# Patient Record
Sex: Female | Born: 1980 | Race: Black or African American | Hispanic: No | Marital: Married | Smoking: Never smoker
Health system: Southern US, Community
[De-identification: ages and names within clinical notes are randomized; demographics above are authoritative.]

## PROBLEM LIST (undated history)

## (undated) DIAGNOSIS — R569 Unspecified convulsions: Secondary | ICD-10-CM

## (undated) DIAGNOSIS — R87619 Unspecified abnormal cytological findings in specimens from cervix uteri: Secondary | ICD-10-CM

## (undated) DIAGNOSIS — IMO0002 Reserved for concepts with insufficient information to code with codable children: Secondary | ICD-10-CM

---

## 1999-11-13 ENCOUNTER — Other Ambulatory Visit: Admission: RE | Admit: 1999-11-13 | Discharge: 1999-11-13 | Payer: Self-pay | Admitting: *Deleted

## 2000-02-12 ENCOUNTER — Encounter: Payer: Self-pay | Admitting: *Deleted

## 2000-02-12 ENCOUNTER — Ambulatory Visit (HOSPITAL_COMMUNITY): Admission: RE | Admit: 2000-02-12 | Discharge: 2000-02-12 | Payer: Self-pay | Admitting: *Deleted

## 2000-03-13 ENCOUNTER — Inpatient Hospital Stay (HOSPITAL_COMMUNITY): Admission: AD | Admit: 2000-03-13 | Discharge: 2000-03-13 | Payer: Self-pay | Admitting: *Deleted

## 2000-03-22 ENCOUNTER — Inpatient Hospital Stay: Admission: AD | Admit: 2000-03-22 | Discharge: 2000-03-22 | Payer: Self-pay | Admitting: *Deleted

## 2000-03-25 ENCOUNTER — Encounter (HOSPITAL_COMMUNITY): Admission: RE | Admit: 2000-03-25 | Discharge: 2000-04-12 | Payer: Self-pay | Admitting: *Deleted

## 2000-03-25 ENCOUNTER — Encounter: Payer: Self-pay | Admitting: *Deleted

## 2000-04-01 ENCOUNTER — Inpatient Hospital Stay (HOSPITAL_COMMUNITY): Admission: AD | Admit: 2000-04-01 | Discharge: 2000-04-01 | Payer: Self-pay | Admitting: *Deleted

## 2000-04-08 ENCOUNTER — Inpatient Hospital Stay (HOSPITAL_COMMUNITY): Admission: AD | Admit: 2000-04-08 | Discharge: 2000-04-13 | Payer: Self-pay | Admitting: Obstetrics and Gynecology

## 2000-04-08 ENCOUNTER — Encounter: Payer: Self-pay | Admitting: Obstetrics & Gynecology

## 2000-04-09 ENCOUNTER — Encounter: Payer: Self-pay | Admitting: Obstetrics & Gynecology

## 2000-05-18 ENCOUNTER — Encounter: Admission: RE | Admit: 2000-05-18 | Discharge: 2000-08-16 | Payer: Self-pay | Admitting: Obstetrics and Gynecology

## 2001-04-04 ENCOUNTER — Emergency Department (HOSPITAL_COMMUNITY): Admission: EM | Admit: 2001-04-04 | Discharge: 2001-04-04 | Payer: Self-pay | Admitting: *Deleted

## 2003-02-18 ENCOUNTER — Other Ambulatory Visit: Admission: RE | Admit: 2003-02-18 | Discharge: 2003-02-18 | Payer: Self-pay

## 2003-09-13 ENCOUNTER — Emergency Department (HOSPITAL_COMMUNITY): Admission: EM | Admit: 2003-09-13 | Discharge: 2003-09-13 | Payer: Self-pay | Admitting: Family Medicine

## 2005-06-11 ENCOUNTER — Emergency Department (HOSPITAL_COMMUNITY): Admission: EM | Admit: 2005-06-11 | Discharge: 2005-06-11 | Payer: Self-pay | Admitting: Family Medicine

## 2010-11-27 ENCOUNTER — Ambulatory Visit (HOSPITAL_COMMUNITY)
Admission: RE | Admit: 2010-11-27 | Discharge: 2010-11-27 | Disposition: A | Discharge status: Home / Self Care | Payer: 59 | Source: Ambulatory Visit | Attending: Obstetrics and Gynecology | Admitting: Obstetrics and Gynecology

## 2010-11-27 ENCOUNTER — Other Ambulatory Visit (HOSPITAL_COMMUNITY): Payer: Self-pay | Admitting: Obstetrics and Gynecology

## 2010-11-27 DIAGNOSIS — O2630 Retained intrauterine contraceptive device in pregnancy, unspecified trimester: Secondary | ICD-10-CM

## 2010-11-27 DIAGNOSIS — Z3689 Encounter for other specified antenatal screening: Secondary | ICD-10-CM | POA: Insufficient documentation

## 2011-01-07 LAB — HEPATITIS B SURFACE ANTIGEN: Hepatitis B Surface Ag: NEGATIVE

## 2011-01-07 LAB — ANTIBODY SCREEN: Antibody Screen: NEGATIVE

## 2011-01-07 LAB — ABO/RH: RH Type: POSITIVE

## 2011-01-07 LAB — GC/CHLAMYDIA PROBE AMP, GENITAL
Chlamydia: NEGATIVE
Gonorrhea: NEGATIVE

## 2011-01-07 LAB — RPR: RPR: NONREACTIVE

## 2011-01-07 LAB — RUBELLA ANTIBODY, IGM: Rubella: IMMUNE

## 2011-01-07 LAB — HIV ANTIBODY (ROUTINE TESTING W REFLEX): HIV: NONREACTIVE

## 2011-05-11 NOTE — L&D Delivery Note (Signed)
Delivery Note At 6:55 AM a viable female, "Monique Bailey",  was delivered via Vaginal, Spontaneous Delivery (Presentation: Middle Occiput Posterior).  APGAR: 9, 9; weight 8 lb 10.1 oz (3915 g).   Placenta status: Intact, Spontaneous.  Cord: 3 vessels with the following complications: None.  Cord pH: NA  Anesthesia: Epidural  Episiotomy: None Lacerations: 1st degree vaginal at introitus, 1st degree periclitoral--minimal suturing needed for hemostasis Suture Repair: 3.0 Monocryl Est. Blood Loss (mL): 200 cc  Mom to postpartum.  Baby to skin to skin with mother.  Nigel Bridgeman 07/12/2011, 7:56 AM

## 2011-06-22 LAB — STREP B DNA PROBE: GBS: NEGATIVE

## 2011-07-08 ENCOUNTER — Encounter (INDEPENDENT_AMBULATORY_CARE_PROVIDER_SITE_OTHER): Payer: 59 | Admitting: Registered Nurse

## 2011-07-08 DIAGNOSIS — Z331 Pregnant state, incidental: Secondary | ICD-10-CM

## 2011-07-11 ENCOUNTER — Inpatient Hospital Stay (HOSPITAL_COMMUNITY)
Admission: AD | Admit: 2011-07-11 | Discharge: 2011-07-14 | DRG: 775 | Disposition: A | Discharge status: Home / Self Care | Payer: 59 | Source: Ambulatory Visit | Attending: Obstetrics and Gynecology | Admitting: Obstetrics and Gynecology

## 2011-07-11 ENCOUNTER — Encounter (HOSPITAL_COMMUNITY): Payer: Self-pay | Admitting: *Deleted

## 2011-07-11 DIAGNOSIS — IMO0001 Reserved for inherently not codable concepts without codable children: Secondary | ICD-10-CM

## 2011-07-11 DIAGNOSIS — IMO0002 Reserved for concepts with insufficient information to code with codable children: Secondary | ICD-10-CM | POA: Diagnosis present

## 2011-07-11 HISTORY — DX: Unspecified convulsions: R56.9

## 2011-07-11 HISTORY — DX: Unspecified abnormal cytological findings in specimens from cervix uteri: R87.619

## 2011-07-11 HISTORY — DX: Reserved for concepts with insufficient information to code with codable children: IMO0002

## 2011-07-11 LAB — AMNISURE RUPTURE OF MEMBRANE (ROM) NOT AT ARMC: Amnisure ROM: POSITIVE

## 2011-07-11 NOTE — ED Provider Notes (Signed)
History    31 yo [phone] (previous preterm delivery of twins 11 years ago) at 68 6/7 weeks presented c/o UCs q 8-10 minutes, with spotting tonight.  Has contracted most of day, but irregularly.  Reports + FM today, questionable episode of wetness at about 8pm.  Cervix was 2 cm at last exam 2 weeks ago.  Pregnancy remarkable for: Previous preterm labor/delivery at 32 weeks 11 years ago Elevated BMI Hx cryo Pregnancy with suspected presence of IUD, but no IUD visualized via [place] (MRI and Xray declined) Remote hx STDs Fibroids Hx seizures as teenager, none since  Chief Complaint  Patient presents with  . Labor Eval     OB History    Grav Para Term Preterm Abortions TAB SAB Ect Mult Living   3 1 0 1 1 0 1 0 1 2       History reviewed. No pertinent past medical history.  History reviewed. No pertinent past surgical history.  History reviewed. No pertinent family history.  History  Substance Use Topics  . Smoking status: Never Smoker   . Smokeless tobacco: Not on file  . Alcohol Use: No    Allergies: No Known Allergies  Prescriptions prior to admission  Medication Sig Dispense Refill  . Prenatal Vit-Fe Fumarate-FA (PRENATAL MULTIVITAMIN) TABS Take 1 tablet by mouth daily.        Physical Exam   Blood pressure 121/69, pulse 93.  Chest clear Heart RRR without murmur Abd gravid, NT Pelvic:  No obvious leaking noted.  Small amount reddish/brown d/c on vulva and on glove after exam.  No active bleeding.  Cervix 2-3, 90%, vtx, -1/-2. Ext WNL  FHR reactive, no decels UCs very irregular.   ED Course  IUP at 37 6/7 weeks Early vs. Prodromal labor  Will ambulate x 1 hour, then re-evaluate.  Nigel Bridgeman, CNM, [place] 07/11/11 11:20pm

## 2011-07-11 NOTE — Progress Notes (Signed)
Pt presents to mau for labor check. Ctx started this evening around 830pm.  Had some pressure this am.  Was 2cm in office on last check.

## 2011-07-11 NOTE — Progress Notes (Signed)
V. Latham, CNM at bedside.  Assessment done and poc discussed with pt.  

## 2011-07-11 NOTE — Progress Notes (Signed)
Amnisure being collected per CNM.

## 2011-07-11 NOTE — Progress Notes (Signed)
Pt up to ambulate at this time.

## 2011-07-12 ENCOUNTER — Encounter (HOSPITAL_COMMUNITY): Payer: Self-pay | Admitting: Anesthesiology

## 2011-07-12 ENCOUNTER — Inpatient Hospital Stay (HOSPITAL_COMMUNITY): Payer: 59 | Admitting: Anesthesiology

## 2011-07-12 ENCOUNTER — Encounter: Payer: 59 | Admitting: Obstetrics and Gynecology

## 2011-07-12 ENCOUNTER — Encounter (HOSPITAL_COMMUNITY): Payer: Self-pay | Admitting: *Deleted

## 2011-07-12 DIAGNOSIS — IMO0002 Reserved for concepts with insufficient information to code with codable children: Secondary | ICD-10-CM | POA: Diagnosis present

## 2011-07-12 DIAGNOSIS — IMO0001 Reserved for inherently not codable concepts without codable children: Secondary | ICD-10-CM

## 2011-07-12 LAB — CBC
HCT: 38.1 % (ref 36.0–46.0)
Hemoglobin: 12.9 g/dL (ref 12.0–15.0)
MCH: 28 pg (ref 26.0–34.0)
MCHC: 33.9 g/dL (ref 30.0–36.0)
MCV: 82.6 fL (ref 78.0–100.0)
Platelets: 238 10*3/uL (ref 150–400)
RBC: 4.61 MIL/uL (ref 3.87–5.11)
RDW: 13.3 % (ref 11.5–15.5)
WBC: 8.9 10*3/uL (ref 4.0–10.5)

## 2011-07-12 LAB — RPR: RPR Ser Ql: NONREACTIVE

## 2011-07-12 MED ORDER — DIPHENHYDRAMINE HCL 50 MG/ML IJ SOLN: 12.5000 mg | INTRAMUSCULAR | Status: DC | PRN

## 2011-07-12 MED ORDER — BENZOCAINE-MENTHOL 20-0.5 % EX AERO: 1.0000 "application " | INHALATION_SPRAY | CUTANEOUS | Status: DC | PRN

## 2011-07-12 MED ORDER — MEASLES, MUMPS & RUBELLA VAC ~~LOC~~ INJ: 0.5000 mL | INJECTION | Freq: Once | SUBCUTANEOUS | Status: DC

## 2011-07-12 MED ORDER — LIDOCAINE HCL (PF) 1 % IJ SOLN
30.0000 mL | INTRAMUSCULAR | Status: DC | PRN
  Administered 2011-07-12: 30 mL via SUBCUTANEOUS
  Filled 2011-07-12: qty 30

## 2011-07-12 MED ORDER — LACTATED RINGERS IV SOLN
INTRAVENOUS | Status: DC
  Administered 2011-07-12: 01:00:00 via INTRAVENOUS
  Administered 2011-07-12: 125 mL/h via INTRAVENOUS

## 2011-07-12 MED ORDER — ONDANSETRON HCL 4 MG PO TABS: 4.0000 mg | ORAL_TABLET | ORAL | Status: DC | PRN

## 2011-07-12 MED ORDER — ONDANSETRON HCL 4 MG/2ML IJ SOLN: 4.0000 mg | INTRAMUSCULAR | Status: DC | PRN

## 2011-07-12 MED ORDER — LANOLIN HYDROUS EX OINT: TOPICAL_OINTMENT | CUTANEOUS | Status: DC | PRN

## 2011-07-12 MED ORDER — CITRIC ACID-SODIUM CITRATE 334-500 MG/5ML PO SOLN: 30.0000 mL | ORAL | Status: DC | PRN

## 2011-07-12 MED ORDER — OXYCODONE-ACETAMINOPHEN 5-325 MG PO TABS
1.0000 | ORAL_TABLET | ORAL | Status: DC | PRN
  Administered 2011-07-13 – 2011-07-14 (×3): 1 via ORAL
  Filled 2011-07-12 (×3): qty 1

## 2011-07-12 MED ORDER — SIMETHICONE 80 MG PO CHEW: 80.0000 mg | CHEWABLE_TABLET | ORAL | Status: DC | PRN

## 2011-07-12 MED ORDER — LIDOCAINE HCL (PF) 1 % IJ SOLN
INTRAMUSCULAR | Status: DC | PRN
  Administered 2011-07-12 (×2): 5 mL

## 2011-07-12 MED ORDER — DIPHENHYDRAMINE HCL 25 MG PO CAPS: 25.0000 mg | ORAL_CAPSULE | Freq: Four times a day (QID) | ORAL | Status: DC | PRN

## 2011-07-12 MED ORDER — FENTANYL 2.5 MCG/ML BUPIVACAINE 1/10 % EPIDURAL INFUSION (WH - ANES)
INTRAMUSCULAR | Status: DC | PRN
  Administered 2011-07-12: 14 mL/h
  Administered 2011-07-12: 16 mL/h via EPIDURAL

## 2011-07-12 MED ORDER — ZOLPIDEM TARTRATE 5 MG PO TABS: 5.0000 mg | ORAL_TABLET | Freq: Every evening | ORAL | Status: DC | PRN

## 2011-07-12 MED ORDER — PHENYLEPHRINE 40 MCG/ML (10ML) SYRINGE FOR IV PUSH (FOR BLOOD PRESSURE SUPPORT)
80.0000 ug | PREFILLED_SYRINGE | INTRAVENOUS | Status: DC | PRN
  Filled 2011-07-12: qty 5

## 2011-07-12 MED ORDER — FENTANYL 2.5 MCG/ML BUPIVACAINE 1/10 % EPIDURAL INFUSION (WH - ANES)
14.0000 mL/h | INTRAMUSCULAR | Status: DC
  Filled 2011-07-12 (×2): qty 60

## 2011-07-12 MED ORDER — FLEET ENEMA 7-19 GM/118ML RE ENEM: 1.0000 | ENEMA | RECTAL | Status: DC | PRN

## 2011-07-12 MED ORDER — BUTORPHANOL TARTRATE 2 MG/ML IJ SOLN: 1.0000 mg | INTRAMUSCULAR | Status: DC | PRN

## 2011-07-12 MED ORDER — SENNOSIDES-DOCUSATE SODIUM 8.6-50 MG PO TABS
2.0000 | ORAL_TABLET | Freq: Every day | ORAL | Status: DC
  Administered 2011-07-12 – 2011-07-13 (×2): 2 via ORAL

## 2011-07-12 MED ORDER — MAGNESIUM HYDROXIDE 400 MG/5ML PO SUSP: 30.0000 mL | ORAL | Status: DC | PRN

## 2011-07-12 MED ORDER — IBUPROFEN 600 MG PO TABS: 600.0000 mg | ORAL_TABLET | Freq: Four times a day (QID) | ORAL | Status: DC | PRN

## 2011-07-12 MED ORDER — EPHEDRINE 5 MG/ML INJ: 10.0000 mg | INTRAVENOUS | Status: DC | PRN

## 2011-07-12 MED ORDER — ACETAMINOPHEN 325 MG PO TABS: 650.0000 mg | ORAL_TABLET | ORAL | Status: DC | PRN

## 2011-07-12 MED ORDER — PRENATAL MULTIVITAMIN CH
1.0000 | ORAL_TABLET | Freq: Every day | ORAL | Status: DC
  Administered 2011-07-12 – 2011-07-13 (×2): 1 via ORAL
  Filled 2011-07-12 (×3): qty 1

## 2011-07-12 MED ORDER — EPHEDRINE 5 MG/ML INJ
10.0000 mg | INTRAVENOUS | Status: DC | PRN
  Filled 2011-07-12: qty 4

## 2011-07-12 MED ORDER — ONDANSETRON HCL 4 MG/2ML IJ SOLN: 4.0000 mg | Freq: Four times a day (QID) | INTRAMUSCULAR | Status: DC | PRN

## 2011-07-12 MED ORDER — LACTATED RINGERS IV SOLN: 500.0000 mL | Freq: Once | INTRAVENOUS | Status: DC

## 2011-07-12 MED ORDER — OXYTOCIN 20 UNITS IN LACTATED RINGERS INFUSION - SIMPLE: 125.0000 mL/h | Freq: Once | INTRAVENOUS | Status: DC

## 2011-07-12 MED ORDER — DIBUCAINE 1 % RE OINT: 1.0000 "application " | TOPICAL_OINTMENT | RECTAL | Status: DC | PRN

## 2011-07-12 MED ORDER — TETANUS-DIPHTH-ACELL PERTUSSIS 5-2.5-18.5 LF-MCG/0.5 IM SUSP: 0.5000 mL | Freq: Once | INTRAMUSCULAR | Status: DC

## 2011-07-12 MED ORDER — PHENYLEPHRINE 40 MCG/ML (10ML) SYRINGE FOR IV PUSH (FOR BLOOD PRESSURE SUPPORT): 80.0000 ug | PREFILLED_SYRINGE | INTRAVENOUS | Status: DC | PRN

## 2011-07-12 MED ORDER — WITCH HAZEL-GLYCERIN EX PADS: 1.0000 "application " | MEDICATED_PAD | CUTANEOUS | Status: DC | PRN

## 2011-07-12 MED ORDER — IBUPROFEN 600 MG PO TABS
600.0000 mg | ORAL_TABLET | Freq: Four times a day (QID) | ORAL | Status: DC
  Administered 2011-07-12 – 2011-07-14 (×8): 600 mg via ORAL
  Filled 2011-07-12 (×8): qty 1

## 2011-07-12 MED ORDER — OXYTOCIN BOLUS FROM INFUSION
500.0000 mL | Freq: Once | INTRAVENOUS | Status: AC
  Administered 2011-07-12: 500 mL via INTRAVENOUS
  Filled 2011-07-12: qty 500
  Filled 2011-07-12: qty 1000

## 2011-07-12 MED ORDER — OXYCODONE-ACETAMINOPHEN 5-325 MG PO TABS: 1.0000 | ORAL_TABLET | ORAL | Status: DC | PRN

## 2011-07-12 MED ORDER — LACTATED RINGERS IV SOLN: 500.0000 mL | INTRAVENOUS | Status: DC | PRN

## 2011-07-12 NOTE — Anesthesia Preprocedure Evaluation (Signed)
Anesthesia Evaluation  Patient identified by MRN, date of birth, ID band Patient awake    Reviewed: Allergy & Precautions, H&P , Patient's Chart, lab work & pertinent test results  Airway Mallampati: III TM Distance: >3 FB Neck ROM: full    Dental No notable dental hx. (+) Teeth Intact   Pulmonary neg pulmonary ROS,  breath sounds clear to auscultation  Pulmonary exam normal       Cardiovascular negative cardio ROS  Rhythm:regular Rate:Normal     Neuro/Psych Remote Hx/o Seizures negative neurological ROS  negative psych ROS   GI/Hepatic negative GI ROS, Neg liver ROS,   Endo/Other  Morbid obesity  Renal/GU negative Renal ROS  negative genitourinary   Musculoskeletal   Abdominal   Peds  Hematology negative hematology ROS (+)   Anesthesia Other Findings   Reproductive/Obstetrics (+) Pregnancy                           Anesthesia Physical Anesthesia Plan  ASA: III  Anesthesia Plan: Epidural   Post-op Pain Management:    Induction:   Airway Management Planned:   Additional Equipment:   Intra-op Plan:   Post-operative Plan:   Informed Consent: I have reviewed the patients History and Physical, chart, labs and discussed the procedure including the risks, benefits and alternatives for the proposed anesthesia with the patient or authorized representative who has indicated his/her understanding and acceptance.     Plan Discussed with: Anesthesiologist  Anesthesia Plan Comments:         Anesthesia Quick Evaluation

## 2011-07-12 NOTE — Progress Notes (Signed)
  Subjective: Comfortable after epidural.    Objective: BP 108/71  Pulse 78  Temp(Src) 98 F (36.7 C) (Oral)  Resp 18  Ht 5\' 11"  (1.803 m)  Wt 124.739 kg (275 lb)  BMI 38.35 kg/m2      FHT: Reasuring, occasional very quick variable. UC:   irregular, every 4-7 minutes SVE:   Dilation: 6 Effacement (%): 100 Station: -1 Exam by:: Emilee Hero, CNM Forebag felt--AROM, clear fluid.  Labs: Lab Results  Component Value Date   WBC 8.9 07/12/2011   HGB 12.9 07/12/2011   HCT 38.1 07/12/2011   MCV 82.6 07/12/2011   PLT 238 07/12/2011    Assessment / Plan: Spontaneous labor, progressing normally  Will observe progress and contraction pattern s/p rupture of forewaters--will augment prn.  Virginie Josten 07/12/2011, 3:17 AM

## 2011-07-12 NOTE — H&P (Signed)
31 yo [id number] (previous preterm delivery of twins 11 years ago) at 9 6/7 weeks presented initally c/o UCs q 8-10 minutes, with spotting tonight. Has contracted most of day, but irregularly. Reports + FM today, questionable episode of wetness at about 8pm. Cervix was 2 cm at last exam 2 weeks ago.   On arrival in [facility], she was initially 2+, now 3-4 cm after walking, with stronger UCs and positive amnisure.  Pregnancy remarkable for:  Previous preterm labor/delivery at 32 weeks 11 years ago  Elevated BMI  Hx cryo  Pregnancy with suspected presence of IUD, but no IUD visualized via [place] (MRI and Xray declined)  Remote hx STDs  Fibroids  Hx seizures as teenager, none since   History of present pregnancy: Patient entered care at 11 weeks.  EDC of 07/26/11 was established by LMP and confirmed by 1st trimester screening US.  Anatomy scan was done at 18 weeks, with normal findings and an anterior placenta.  She had an IUD in place prior to pregnancy, but this was unable to be visualized on physical exam or [place] in the first trimester.  She declined MRI or Xray, with Dr. Su Hilt recommending f/u postpartum.  Further ultrasounds were done at 31 weeks for size > dates, with growth at the 81%ile and normal fluid.  Her prenatal course was essentially uncomplicated.  She was treated for BV at 22 weeks.   At her last cervical evaluation 2 weeks ago, she was 2 cm..  Chief Complaint   Patient presents with   l Labor and SROM   OB History    Grav  Para  Term  Preterm  Abortions  TAB  SAB  Ect  Mult  Living    3  1  0  1  1  0  1  0  1  2     #1--2001.  SVB twins at 32 weeks, with spontaneous onset of PTL.  Babies weighed 4 +4.  Had epidural anesthesia.  They were in NICU for 2 weeks and have subsequently done well. #2--2002 TAB #3--Current  Medical history:  Hx cryo 2005.  Previous IUD, with unknown status at the time of conception (see above).  Hx immunity to toxo documented in previous pregnancy.  Hx fibroids.   Remote history chlamydia 2001.  Seizures as child and teenager, none since adulthood.  UTI x 1 in past.   Surgical history:  TAB 2005.  Wisdom teeth 1995.  Family history:  Father heart disease.  MGM varicosities.  Sister anemia.  MGM, daughter asthma.  MGM diabetes.  MGM Alzheimers.  Father RA.  Mother breast cancer x1, negative BRCA testing.  MGM ovarian cancer.  Father smoker and etoh use.    Social history:  Single, FOB (Clenza Mark, III) involved and supportive.  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  FOB has 2 years of college, employed as Journalist, newspaper.  ALLERGIES:  NONE  History   Substance Use Topics   .  Smoking status:  Never Smoker   .  Smokeless tobacco:  Not on file   .  Alcohol Use:  No     Prescriptions prior to admission   Medication  Sig  Dispense  Refill   .  Prenatal Vit-Fe Fumarate-FA (PRENATAL MULTIVITAMIN) TABS  Take 1 tablet by mouth daily.      Physical Exam   Blood pressure 121/69, pulse 93.  Chest clear  Heart RRR without murmur  Abd gravid, NT  Pelvic: Re-evaluated after walking, now 3-4, 100%, vtx -1. Ext WNL  FHR reactive, no decels  UCs q 5 minutes, moderate.  Amnisure positive.  Prenatal labs: ABO, Rh:  O+ Antibody:  Neg Rubella:  Immune RPR:   NR HBsAg:   Neg HIV:   Neg GBS:   Negative Sickle cell negative 1st trimester screen and AFP negative Glucola WNL Hgb at NOB 13.2/12.5 at 28 weeks Cultures negative at NOB.  Assessment/Plan: IUP at 38 weeks SROM Early labor Negative GBS  Plan: Admit to Birthing Suite per consult with Dr. Stefano Gaul Routine CNM orders Patient plans epidural Will observe labor status, and augment as needed.   Nigel Bridgeman 07/12/11 12:40am

## 2011-07-12 NOTE — Progress Notes (Signed)
  Subjective: Feeling more pain on right side (has been positioned on left side).  No urge to push.  Objective: BP 119/66  Pulse 91  Temp(Src) 98 F (36.7 C) (Oral)  Resp 18  Ht 5\' 11"  (1.803 m)  Wt 124.739 kg (275 lb)  BMI 38.35 kg/m2      FHT:  reassuring UC:   q 2-4 min SVE:   8 cm, 100%, vtx -1    Assessment / Plan: Progressing spontaneously Will continue to observe. Use PCA prn, position to facilitate descent and distribution of epidural effects.  Nigel Bridgeman 07/12/2011, 4:29 AM

## 2011-07-12 NOTE — Anesthesia Procedure Notes (Signed)
Epidural Patient location during procedure: OB Start time: 07/12/2011 2:07 AM  Staffing Anesthesiologist: Malen Gauze, Raeshawn Vo A. Performed by: anesthesiologist   Preanesthetic Checklist Completed: patient identified, site marked, surgical consent, pre-op evaluation, timeout performed, IV checked, risks and benefits discussed and monitors and equipment checked  Epidural Patient position: sitting Prep: site prepped and draped and DuraPrep Patient monitoring: continuous pulse ox and blood pressure Approach: midline Injection technique: LOR air  Needle:  Needle type: Tuohy  Needle gauge: 17 G Needle length: 9 cm Needle insertion depth: 7 cm Catheter type: closed end flexible Catheter size: 19 Gauge Catheter at skin depth: 12 cm Test dose: negative and Other  Assessment Events: blood not aspirated, injection not painful, no injection resistance, negative IV test and no paresthesia  Additional Notes Patient identified. Risks and benefits discussed including failed block, incomplete  Pain control, post dural puncture headache, nerve damage, paralysis, blood pressure Changes, nausea, vomiting, reactions to medications-both toxic and allergic and post Partum back pain. All questions were answered. Patient expressed understanding and wished to proceed. Sterile technique was used throughout procedure. Epidural site was Dressed with sterile barrier dressing. No paresthesias, signs of intravascular injection Or signs of intrathecal spread were encountered.  Patient was more comfortable after the epidural was dosed. Please see RN's note for documentation of vital signs and FHR which are stable.

## 2011-07-12 NOTE — Progress Notes (Signed)
UR chart review completed.  

## 2011-07-12 NOTE — Progress Notes (Signed)
Pt may go to room 163. 

## 2011-07-13 LAB — CBC
HCT: 34.4 % — ABNORMAL LOW (ref 36.0–46.0)
Hemoglobin: 11.4 g/dL — ABNORMAL LOW (ref 12.0–15.0)
MCH: 27.8 pg (ref 26.0–34.0)
MCHC: 33.1 g/dL (ref 30.0–36.0)
MCV: 83.9 fL (ref 78.0–100.0)
Platelets: 195 10*3/uL (ref 150–400)
RBC: 4.1 MIL/uL (ref 3.87–5.11)
RDW: 13.4 % (ref 11.5–15.5)
WBC: 10.4 10*3/uL (ref 4.0–10.5)

## 2011-07-13 NOTE — Progress Notes (Addendum)
Post Partum Day 1 Subjective: no complaints.  Up ad lib, denies syncope or dizziness.  Breast feeding.  Considering Nexplanon or other measures, rather than Mirena (due to conception with Mirena in place).  Objective: Blood pressure 124/85, pulse 82, temperature 98.5 F (36.9 C), temperature source Oral, resp. rate 19, height 5\' 11"  (1.803 m), weight 124.739 kg (275 lb), unknown if currently breastfeeding.  Physical Exam:  General: alert Lochia: appropriate Uterine Fundus: firm Incision: healing well DVT Evaluation: No evidence of DVT seen on physical exam.   Basename 07/13/11 0528 07/12/11 0045  HGB 11.4* 12.9  HCT 34.4* 38.1    Assessment/Plan: Plan for discharge tomorrow Will review contraception options again before discharge.   LOS: 2 days   Nigel Bridgeman 07/13/2011, 9:48 AM    Agree with above

## 2011-07-13 NOTE — Anesthesia Postprocedure Evaluation (Signed)
  Anesthesia Post-op Note  Patient: Monique Bailey  Procedure(s) Performed: * No procedures listed *  Patient Location: Mother/Baby  Anesthesia Type: Epidural  Level of Consciousness: awake  Airway and Oxygen Therapy: Patient Spontanous Breathing  Post-op Pain: none  Post-op Assessment: Post-op Vital signs reviewed  Post-op Vital Signs: Reviewed and stable  Complications: No apparent anesthesia complications

## 2011-07-14 ENCOUNTER — Encounter (HOSPITAL_COMMUNITY)
Admission: RE | Admit: 2011-07-14 | Discharge: 2011-07-14 | Disposition: A | Discharge status: Home / Self Care | Payer: 59 | Source: Ambulatory Visit | Attending: Obstetrics and Gynecology | Admitting: Obstetrics and Gynecology

## 2011-07-14 DIAGNOSIS — O923 Agalactia: Secondary | ICD-10-CM | POA: Insufficient documentation

## 2011-07-14 MED ORDER — IBUPROFEN 600 MG PO TABS: 600.0000 mg | ORAL_TABLET | Freq: Four times a day (QID) | ORAL | Status: AC

## 2011-07-14 MED ORDER — OXYCODONE-ACETAMINOPHEN 5-325 MG PO TABS: 1.0000 | ORAL_TABLET | ORAL | Status: AC | PRN

## 2011-07-14 NOTE — Discharge Summary (Signed)
  Obstetric Discharge Summary Reason for Admission: onset of labor Prenatal Procedures: ultrasound Intrapartum Procedures: spontaneous vaginal delivery Postpartum Procedures: none Complications-Operative and Postpartum: 1st degree vaginal laceration at intriotus and 1st degree periclitoral  Temp:  [97.7 F (36.5 C)-98.6 F (37 C)] 97.7 F (36.5 C) (03/06 0540) Pulse Rate:  [77-89] 77  (03/06 0540) Resp:  [20] 20  (03/06 0540) BP: (123-126)/(81-87) 123/82 mmHg (03/06 0540) Hemoglobin  Date Value Range Status  07/13/2011 11.4* 12.0-15.0 (g/dL) Final     HCT  Date Value Range Status  07/13/2011 34.4* 36.0-46.0 (%) Final   S: PP vaginal day 2. Denies complaints presently. Doing well. Breastfeeding. Reports ambulating without difficulty.    Assessment/Plan  General: alert oriented Scant lochia, fundus firm Breastfeeding going well Negative Homan's-no sign of DVT  Desires condoms as BCM presently, contemplating Nexplanon RTO x 3 weeks (per [facility]) with Dr. Su Hilt re; f/u conception with Mirena Nothing in vagina x 6 weeks. PP visit 6 weeks. Call office for both appointments    Hospital Course:  Hospital Course: Admitted in labor. Negative GBS. Progressed to fully dilated. Delivery was performed by V. Emilee Hero, CNM without difficulty. Patient and baby tolerated the procedure without difficulty, with a  1st degree vaginal laceration at intriotus and 1st degree periclitoral laceration noted. Infant to FTN. Mother and infant then had an uncomplicated postpartum course, with breast feeding going well. Mom's physical exam was WNL, and she was discharged home in stable condition. Contraception plan was condoms, may desire Nexplanon after PP examination.  She received adequate benefit from po pain medications.  Discharge Diagnoses: Term Pregnancy-delivered                                          Conceived with Mirena   Discharge Information: Date: 07/14/2011 Activity: nothing in vagina x 6  weeks Diet: routine Medications:  Medication List  As of 07/14/2011  9:11 AM   START taking these medications         ibuprofen 600 MG tablet   Commonly known as: ADVIL,MOTRIN   Take 1 tablet (600 mg total) by mouth every 6 (six) hours.      oxyCODONE-acetaminophen 5-325 MG per tablet   Commonly known as: PERCOCET   Take 1-2 tablets by mouth every 4 (four) hours as needed (moderate - severe pain).         CONTINUE taking these medications         prenatal multivitamin Tabs          Where to get your medications    These are the prescriptions that you need to pick up.   You may get these medications from any pharmacy.         ibuprofen 600 MG tablet   oxyCODONE-acetaminophen 5-325 MG per tablet           Condition: stable Instructions: refer to practice specific booklet Discharge to: home Follow-up Information    Follow up with [facility]-Dr. Su Hilt. Schedule an appointment as soon as possible for a visit in 3 weeks.         Newborn Data: Live born  Information for the patient's newborn:  Raidyn, Breiner [[id number]]  female ; APGAR 9,9 ; weight 8lbs. Johnsie Kindred CORI 07/14/2011, 9:11 AM

## 2011-07-14 NOTE — Discharge Instructions (Signed)
Vaginal Delivery Care After  Change your pad on each trip to the bathroom.   Wipe gently with toilet paper during your hospital stay. Always wipe from front to back. A spray bottle with warm tap water could also be used or a towelette if available.   Place your soiled pad and toilet paper in a bathroom wastebasket with a plastic bag liner.   During your hospital stay, save any clots. If you pass a clot while on the toilet, do not flush it. Also, if your vaginal flow seems excessive to you, notify nursing personnel.   The first time you get out of bed after delivery, wait for assistance from a nurse. Do not get up alone at any time if you feel weak or dizzy.   Bend and extend your ankles forcefully so that you feel the calves of your legs get hard. Do this 6 times every hour when you are in bed and awake.   Do not sit with one foot under you, dangle your legs over the edge of the bed, or maintain a position that hinders the circulation in your legs.   Many women experience after pains for 2 to 3 days after delivery. These after pains are mild uterine contractions. Ask the nurse for a pain medication if you need something for this. Sometimes breastfeeding stimulates after pains; if you find this to be true, ask for the medication  -  hour before the next feeding.   For you and your infant's protection, do not go beyond the door(s) of the obstetric unit. Do not carry your baby in your arms in the hallway. When taking your baby to and from your room, put your baby in the bassinet and push the bassinet.   Mothers may have their babies in their room as much as they desire.   Breastfeeding BENEFITS OF BREASTFEEDING For the baby  The first milk (colostrum) helps the baby's digestive system function better.   There are antibodies from the mother in the milk that help the baby fight off infections.   The baby has a lower incidence of asthma, allergies, and SIDS (sudden infant death syndrome).     The nutrients in breast milk are better than formulas for the baby and helps the baby's brain grow better.   Babies who breastfeed have less gas, colic, and constipation.  For the mother  Breastfeeding helps develop a very special bond between mother and baby.   It is more convenient, always available at the correct temperature and cheaper than formula feeding.   It burns calories in the mother and helps with losing weight that was gained during pregnancy.   It makes the uterus contract back down to normal size faster and slows bleeding following delivery.   Breastfeeding mothers have a lower risk of developing breast cancer.  NURSE FREQUENTLY  A healthy, full-term baby may breastfeed as often as every hour or space his or her feedings to every 3 hours.   How often to nurse will vary from baby to baby. Watch your baby for signs of hunger, not the clock.   Nurse as often as the baby requests, or when you feel the need to reduce the fullness of your breasts.   Awaken the baby if it has been 3 to 4 hours since the last feeding.   Frequent feeding will help the mother make more milk and will prevent problems like sore nipples and engorgement of the breasts.  BABY'S POSITION AT THE BREAST    Whether lying down or sitting, be sure that the baby's tummy is facing your tummy.   Support the breast with 4 fingers underneath the breast and the thumb above. Make sure your fingers are well away from the nipple and baby's mouth.   Stroke the baby's lips and cheek closest to the breast gently with your finger or nipple.   When the baby's mouth is open wide enough, place all of your nipple and as much of the dark area around the nipple as possible into your baby's mouth.   Pull the baby in close so the tip of the nose and the baby's cheeks touch the breast during the feeding.  FEEDINGS  The length of each feeding varies from baby to baby and from feeding to feeding.   The baby must suck  about 2 to 3 minutes for your milk to get to him or her. This is called a "let down." For this reason, allow the baby to feed on each breast as long as he or she wants. Your baby will end the feeding when he or she has received the right balance of nutrients.   To break the suction, put your finger into the corner of the baby's mouth and slide it between his or her gums before removing your breast from his or her mouth. This will help prevent sore nipples.  REDUCING BREAST ENGORGEMENT  In the first week after your baby is born, you may experience signs of breast engorgement. When breasts are engorged, they feel heavy, warm, full, and may be tender to the touch. You can reduce engorgement if you:   Nurse frequently, every 2 to 3 hours. Mothers who breastfeed early and often have fewer problems with engorgement.   Place light ice packs on your breasts between feedings. This reduces swelling. Wrap the ice packs in a lightweight towel to protect your skin.   Apply moist hot packs to your breast for 5 to 10 minutes before each feeding. This increases circulation and helps the milk flow.   Gently massage your breast before and during the feeding.   Make sure that the baby empties at least one breast at every feeding before switching sides.   Use a breast pump to empty the breasts if your baby is sleepy or not nursing well. You may also want to pump if you are returning to work or or you feel you are getting engorged.   Avoid bottle feeds, pacifiers or supplemental feedings of water or juice in place of breastfeeding.   Be sure the baby is latched on and positioned properly while breastfeeding.   Prevent fatigue, stress, and anemia.   Wear a supportive bra, avoiding underwire styles.   Eat a balanced diet with enough fluids.  If you follow these suggestions, your engorgement should improve in 24 to 48 hours. If you are still experiencing difficulty, call your lactation consultant or  caregiver. IS MY BABY GETTING ENOUGH MILK? Sometimes, mothers worry about whether their babies are getting enough milk. You can be assured that your baby is getting enough milk if:  The baby is actively sucking and you hear swallowing.   The baby nurses at least 8 to 12 times in a 24 hour time period. Nurse your baby until he or she unlatches or falls asleep at the first breast (at least 10 to 20 minutes), then offer the second side.   The baby is wetting 5 to 6 disposable diapers (6 to 8 cloth diapers) in a   24 hour period by 5 to 6 days of age.   The baby is having at least 2 to 3 stools every 24 hours for the first few months. Breast milk is all the food your baby needs. It is not necessary for your baby to have water or formula. In fact, to help your breasts make more milk, it is best not to give your baby supplemental feedings during the early weeks.   The stool should be soft and yellow.   The baby should gain 4 to 7 ounces per week after he is 4 days old.  TAKE CARE OF YOURSELF Take care of your breasts by:  Bathing or showering daily.   Avoiding the use of soaps on your nipples.   Start feedings on your left breast at one feeding and on your right breast at the next feeding.   You will notice an increase in your milk supply 2 to 5 days after delivery. You may feel some discomfort from engorgement, which makes your breasts very firm and often tender. Engorgement "peaks" out within 24 to 48 hours. In the meantime, apply warm moist towels to your breasts for 5 to 10 minutes before feeding. Gentle massage and expression of some milk before feeding will soften your breasts, making it easier for your baby to latch on. Wear a well fitting nursing bra and air dry your nipples for 10 to 15 minutes after each feeding.   Only use cotton bra pads.   Only use pure lanolin on your nipples after nursing. You do not need to wash it off before nursing.  Take care of yourself by:   Eating  well-balanced meals and nutritious snacks.   Drinking milk, fruit juice, and water to satisfy your thirst (about 8 glasses a day).   Getting plenty of rest.   Increasing calcium in your diet (1200 mg a day).   Avoiding foods that you notice affect the baby in a bad way.  SEEK MEDICAL CARE IF:   You have any questions or difficulty with breastfeeding.   You need help.   You have a hard, red, sore area on your breast, accompanied by a fever of 100.5 F (38.1 C) or more.   Your baby is too sleepy to eat well or is having trouble sleeping.   Your baby is wetting less than 6 diapers per day, by 5 days of age.   Your baby's skin or white part of his or her eyes is more yellow than it was in the hospital.   You feel depressed.  Document Released: 04/26/2005 Document Revised: 04/15/2011 Document Reviewed: 12/09/2008 [employer] Patient Information 2012 [employer]. 

## 2011-08-05 ENCOUNTER — Encounter (INDEPENDENT_AMBULATORY_CARE_PROVIDER_SITE_OTHER): Payer: 59 | Admitting: Obstetrics and Gynecology

## 2011-08-10 ENCOUNTER — Ambulatory Visit (HOSPITAL_COMMUNITY)
Admission: RE | Admit: 2011-08-10 | Discharge: 2011-08-10 | Disposition: A | Discharge status: Home / Self Care | Payer: 59 | Source: Ambulatory Visit | Attending: Obstetrics and Gynecology | Admitting: Obstetrics and Gynecology

## 2011-08-10 ENCOUNTER — Other Ambulatory Visit: Payer: Self-pay | Admitting: Obstetrics and Gynecology

## 2011-08-10 DIAGNOSIS — Z30431 Encounter for routine checking of intrauterine contraceptive device: Secondary | ICD-10-CM | POA: Insufficient documentation

## 2011-08-10 DIAGNOSIS — T8332XA Displacement of intrauterine contraceptive device, initial encounter: Secondary | ICD-10-CM

## 2011-08-14 ENCOUNTER — Encounter (HOSPITAL_COMMUNITY)
Admission: RE | Admit: 2011-08-14 | Discharge: 2011-08-14 | Disposition: A | Discharge status: Home / Self Care | Payer: 59 | Source: Ambulatory Visit | Attending: Obstetrics and Gynecology | Admitting: Obstetrics and Gynecology

## 2011-08-14 DIAGNOSIS — O923 Agalactia: Secondary | ICD-10-CM | POA: Insufficient documentation

## 2011-09-02 ENCOUNTER — Ambulatory Visit (INDEPENDENT_AMBULATORY_CARE_PROVIDER_SITE_OTHER): Payer: 59 | Admitting: Obstetrics and Gynecology

## 2011-09-02 ENCOUNTER — Encounter: Payer: Self-pay | Admitting: Obstetrics and Gynecology

## 2011-09-02 VITALS — BP 122/76 | Temp 98.8°F | Resp 16 | Ht 71.0 in | Wt 243.0 lb

## 2011-09-02 DIAGNOSIS — N898 Other specified noninflammatory disorders of vagina: Secondary | ICD-10-CM

## 2011-09-02 DIAGNOSIS — N912 Amenorrhea, unspecified: Secondary | ICD-10-CM

## 2011-09-02 DIAGNOSIS — Z309 Encounter for contraceptive management, unspecified: Secondary | ICD-10-CM | POA: Insufficient documentation

## 2011-09-02 LAB — POCT WET PREP (WET MOUNT)
Clue Cells Wet Prep Whiff POC: NEGATIVE
pH: 4.5

## 2011-09-02 LAB — POCT URINE PREGNANCY: Preg Test, Ur: NEGATIVE

## 2011-09-02 MED ORDER — ETONOGESTREL 68 MG ~~LOC~~ IMPL: 1.0000 | DRUG_IMPLANT | Freq: Once | SUBCUTANEOUS | Status: AC

## 2011-09-02 NOTE — Progress Notes (Signed)
Addended by: Mathis Bud on: 09/02/2011 03:26 PM   Modules accepted: Orders

## 2011-09-02 NOTE — Progress Notes (Signed)
Female Date of delivery: 07/12/2011 Name: Monique Bailey Vaginal delivery:yes Cesarean section:no Tubal ligation:no GDM:no Breast Feeding:no Bottle Feeding:yes Post-Partum Blues:yes Abnormal pap:no Normal GU function: yes Normal GI function:yes Returning to work:yes  LMP: none INSERTION DATE: 09/02/2011 REMOVAL DATE: 09/02/2014 INSERTION ARM: L PALPATED AFTER INSERT: yes IS PT SWITCHING FROM HORMONAL BC: yes LOT #: [id number] EXP: 10/2013  Filed Vitals:  09/02/11 1231 BP: 122/76 Temp: 98.8 F (37.1 C) Resp: 16   Nexplanon inserted per protocol without diffuculty Pelvic exam: normal external genitalia, vulva, vagina, cervix, uterus and adnexa.  Results for orders placed in visit on 09/02/11 POCT URINE PREGNANCY     Component Value Range  Preg Test, Ur Negative   POCT WET PREP (WET MOUNT)     Component Value Range  Source Wet Prep POC vaginal    WBC, Wet Prep HPF POC      Bacteria Wet Prep HPF POC none    BACTERIA WET PREP MORPHOLOGY POC      Clue Cells Wet Prep HPF POC None    CLUE CELLS WET PREP WHIFF POC Negative Whiff    Yeast Wet Prep HPF POC None    KOH Wet Prep POC      Trichomonas Wet Prep HPF POC none    pH 4.5     A/P RTO in 01/2012 for AEX Last pap 01/07/12 sched baseline mammo secondary to mom with h/o breast cs in her thirties Refer to Dr. Allyne Gee secondary to pt desires wt loss pills  [name] Y

## 2011-09-14 ENCOUNTER — Encounter (HOSPITAL_COMMUNITY)
Admission: RE | Admit: 2011-09-14 | Discharge: 2011-09-14 | Disposition: A | Discharge status: Home / Self Care | Payer: 59 | Source: Ambulatory Visit | Attending: Obstetrics and Gynecology | Admitting: Obstetrics and Gynecology

## 2011-09-14 DIAGNOSIS — O923 Agalactia: Secondary | ICD-10-CM | POA: Insufficient documentation

## 2011-10-15 ENCOUNTER — Encounter (HOSPITAL_COMMUNITY)
Admission: RE | Admit: 2011-10-15 | Discharge: 2011-10-15 | Disposition: A | Discharge status: Home / Self Care | Payer: 59 | Source: Ambulatory Visit | Attending: Obstetrics and Gynecology | Admitting: Obstetrics and Gynecology

## 2011-10-15 DIAGNOSIS — O923 Agalactia: Secondary | ICD-10-CM | POA: Insufficient documentation

## 2011-10-19 ENCOUNTER — Encounter (HOSPITAL_COMMUNITY): Payer: 59

## 2011-11-15 ENCOUNTER — Encounter (HOSPITAL_COMMUNITY)
Admission: RE | Admit: 2011-11-15 | Discharge: 2011-11-15 | Disposition: A | Discharge status: Home / Self Care | Payer: 59 | Source: Ambulatory Visit | Attending: Obstetrics and Gynecology | Admitting: Obstetrics and Gynecology

## 2011-11-15 DIAGNOSIS — O923 Agalactia: Secondary | ICD-10-CM | POA: Insufficient documentation

## 2011-12-16 ENCOUNTER — Encounter (HOSPITAL_COMMUNITY)
Admission: RE | Admit: 2011-12-16 | Discharge: 2011-12-16 | Disposition: A | Discharge status: Home / Self Care | Payer: 59 | Source: Ambulatory Visit | Attending: Obstetrics and Gynecology | Admitting: Obstetrics and Gynecology

## 2011-12-16 DIAGNOSIS — O923 Agalactia: Secondary | ICD-10-CM | POA: Insufficient documentation

## 2012-01-16 ENCOUNTER — Encounter (HOSPITAL_COMMUNITY)
Admission: RE | Admit: 2012-01-16 | Discharge: 2012-01-16 | Disposition: A | Discharge status: Home / Self Care | Payer: 59 | Source: Ambulatory Visit | Attending: Obstetrics and Gynecology | Admitting: Obstetrics and Gynecology

## 2012-01-16 DIAGNOSIS — O923 Agalactia: Secondary | ICD-10-CM | POA: Insufficient documentation

## 2012-02-16 ENCOUNTER — Encounter (HOSPITAL_COMMUNITY)
Admission: RE | Admit: 2012-02-16 | Discharge: 2012-02-16 | Disposition: A | Discharge status: Home / Self Care | Payer: 59 | Source: Ambulatory Visit | Attending: Obstetrics and Gynecology | Admitting: Obstetrics and Gynecology

## 2012-02-16 DIAGNOSIS — O923 Agalactia: Secondary | ICD-10-CM | POA: Insufficient documentation

## 2012-03-18 ENCOUNTER — Encounter (HOSPITAL_COMMUNITY)
Admission: RE | Admit: 2012-03-18 | Discharge: 2012-03-18 | Disposition: A | Discharge status: Home / Self Care | Payer: 59 | Source: Ambulatory Visit | Attending: Obstetrics and Gynecology | Admitting: Obstetrics and Gynecology

## 2012-03-18 DIAGNOSIS — O923 Agalactia: Secondary | ICD-10-CM | POA: Insufficient documentation

## 2014-03-11 ENCOUNTER — Encounter: Payer: Self-pay | Admitting: Obstetrics and Gynecology

## 2014-11-26 ENCOUNTER — Emergency Department (INDEPENDENT_AMBULATORY_CARE_PROVIDER_SITE_OTHER)
Admission: EM | Admit: 2014-11-26 | Discharge: 2014-11-26 | Disposition: A | Discharge status: Home / Self Care | Payer: Self-pay | Source: Home / Self Care | Attending: Emergency Medicine | Admitting: Emergency Medicine

## 2014-11-26 ENCOUNTER — Encounter (HOSPITAL_COMMUNITY): Payer: Self-pay | Admitting: Emergency Medicine

## 2014-11-26 DIAGNOSIS — N390 Urinary tract infection, site not specified: Secondary | ICD-10-CM

## 2014-11-26 LAB — POCT URINALYSIS DIP (DEVICE)
Bilirubin Urine: NEGATIVE
Glucose, UA: NEGATIVE mg/dL
Ketones, ur: NEGATIVE mg/dL
Nitrite: POSITIVE — AB
Protein, ur: 100 mg/dL — AB
Specific Gravity, Urine: >=1.03 (ref 1.005–1.030)
Urobilinogen, UA: 0.2 mg/dL (ref 0.0–1.0)
pH: 6 (ref 5.0–8.0)

## 2014-11-26 LAB — POCT PREGNANCY, URINE: Preg Test, Ur: NEGATIVE

## 2014-11-26 MED ORDER — CEPHALEXIN 500 MG PO CAPS: 500.0000 mg | ORAL_CAPSULE | Freq: Four times a day (QID) | ORAL | Status: DC

## 2014-11-26 NOTE — Discharge Instructions (Signed)
You have a urinary tract infection. Take Keflex one pill 4 times a day for 5 days. We will call you if we need to change antibiotics based on culture results. You can take Tylenol or ibuprofen as needed for pain. You should see improvement in the next 24 hours. Follow-up as needed.

## 2014-11-26 NOTE — ED Provider Notes (Signed)
CSN: [id number]     Arrival date & time 11/26/14  1821 History   First MD Initiated Contact with Patient 11/26/14 1848     Chief Complaint  Patient presents with  . Urinary Tract Infection   (Consider location/radiation/quality/duration/timing/severity/associated sxs/prior Treatment) HPI She is a 34 year old woman here for evaluation of dysuria. She states she woke up early Sunday morning with shooting pains in her lower abdomen. She reports dysuria as well as frequency. She did have a brief episode of right side pain yesterday. She has been taking AZO and drinking water and cranberry juice without improvement. No fevers or chills. No nausea or vomiting. No vaginal discharge. She does report a pressure sensation with walking.  Past Medical History  Diagnosis Date  . Seizures age 34, last seizure  . Abnormal Pap smear hx cryo   History reviewed. No pertinent past surgical history. History reviewed. No pertinent family history. History  Substance Use Topics  . Smoking status: Never Smoker   . Smokeless tobacco: Not on file  . Alcohol Use: No   OB History    Gravida Para Term Preterm AB TAB SAB Ectopic Multiple Living   3 2 1 1 1  0 1 0 1 3     Review of Systems As in history of present illness Allergies  Review of patient's allergies indicates no known allergies.  Home Medications   Prior to Admission medications   Medication Sig Start Date End Date Taking? Authorizing Provider  cephALEXin (KEFLEX) 500 MG capsule Take 1 capsule (500 mg total) by mouth 4 (four) times daily. 11/26/14   Charm RingsErin J Lj Miyamoto, MD  etonogestrel (IMPLANON) 68 MG IMPL implant Inject 1 each (68 mg total) into the skin once. 09/02/11 09/01/12  Osborn CohoAngela Roberts, MD  Prenatal Vit-Fe Fumarate-FA (PRENATAL MULTIVITAMIN) TABS Take 1 tablet by mouth daily.    Historical Provider, MD   BP 126/82 mmHg  Pulse 91  Temp(Src) 98.4 F (36.9 C) (Oral)  Resp 16  SpO2 99% Physical Exam  Constitutional: She is oriented to  person, place, and time. She appears well-developed and well-nourished. No distress.  Cardiovascular: Normal rate.   Pulmonary/Chest: Effort normal.  Abdominal: Soft. Bowel sounds are normal. She exhibits no distension and no mass. There is tenderness (in suprapubic). There is no rebound and no guarding.  No CVA tenderness  Neurological: She is alert and oriented to person, place, and time.    ED Course  Procedures (including critical care time) Labs Review Labs Reviewed  POCT URINALYSIS DIP (DEVICE) - Abnormal; Notable for the following:    Hgb urine dipstick LARGE (*)    Protein, ur 100 (*)    Nitrite POSITIVE (*)    Leukocytes, UA SMALL (*)    All other components within normal limits  URINE CULTURE  POCT PREGNANCY, URINE    Imaging Review No results found.   MDM   1. UTI (lower urinary tract infection)    Urine sent for culture. Treat with Keflex for 5 days. Follow-up as needed.    Charm RingsErin J Hermena Swint, MD 11/26/14 [phone]

## 2014-11-26 NOTE — ED Notes (Signed)
C/o uti States she has vaginal discomfort, lower abd pain and pain when urinating Azo and water used as tx

## 2014-11-28 LAB — URINE CULTURE: Culture: >=100000

## 2014-11-29 NOTE — ED Notes (Signed)
Final report of UA culture positive for UTI, treatment adequate w Rx provided day of [facility] visit

## 2014-12-02 MED ORDER — CIPROFLOXACIN HCL 500 MG PO TABS: 500.0000 mg | ORAL_TABLET | Freq: Two times a day (BID) | ORAL | Status: AC

## 2019-08-11 ENCOUNTER — Ambulatory Visit: Payer: Self-pay | Attending: Internal Medicine

## 2019-08-11 ENCOUNTER — Ambulatory Visit: Payer: Self-pay

## 2019-08-11 DIAGNOSIS — Z23 Encounter for immunization: Secondary | ICD-10-CM

## 2019-08-11 NOTE — Progress Notes (Signed)
   Covid-19 Vaccination Clinic  Name:  Monique Bailey    MRN: [id number] DOB: 1980-06-13  08/11/2019  [name] was observed post Covid-19 immunization for 15 minutes without incident. She was provided with Vaccine Information Sheet and instruction to access the V-Safe system.   [name] was instructed to call 911 with any severe reactions post vaccine: Marland Kitchen Difficulty breathing  . Swelling of face and throat  . A fast heartbeat  . A bad rash all over body  . Dizziness and weakness   Immunizations Administered    Name Date Dose VIS Date Route   Moderna COVID-19 Vaccine 08/11/2019 11:46 AM 0.5 mL 04/10/2019 Intramuscular   Manufacturer: Moderna   Lot: [id number]   NDC: 06999-672-27

## 2019-09-08 ENCOUNTER — Ambulatory Visit: Payer: Self-pay | Attending: Critical Care Medicine

## 2019-09-08 DIAGNOSIS — Z23 Encounter for immunization: Secondary | ICD-10-CM

## 2019-09-08 NOTE — Progress Notes (Signed)
   Covid-19 Vaccination Clinic  Name:  Monique Bailey    MRN: [id number] DOB: 06-22-1980  09/08/2019  [name] was observed post Covid-19 immunization for 15 minutes without incident. She was provided with Vaccine Information Sheet and instruction to access the V-Safe system.   [name] was instructed to call 911 with any severe reactions post vaccine: Marland Kitchen Difficulty breathing  . Swelling of face and throat  . A fast heartbeat  . A bad rash all over body  . Dizziness and weakness   Immunizations Administered    Name Date Dose VIS Date Route   Moderna COVID-19 Vaccine 09/08/2019 11:42 AM 0.5 mL 04/2019 Intramuscular   Manufacturer: Moderna   Lot: [id number]   NDC: 41030-131-43

## 2020-08-14 ENCOUNTER — Other Ambulatory Visit (HOSPITAL_COMMUNITY): Payer: Self-pay | Admitting: Gastroenterology

## 2020-08-14 ENCOUNTER — Other Ambulatory Visit: Payer: Self-pay | Admitting: Gastroenterology

## 2020-08-14 DIAGNOSIS — R1011 Right upper quadrant pain: Secondary | ICD-10-CM

## 2020-09-03 ENCOUNTER — Encounter (HOSPITAL_COMMUNITY)
Admission: RE | Admit: 2020-09-03 | Discharge: 2020-09-03 | Disposition: A | Discharge status: Home / Self Care | Payer: BC Managed Care – PPO | Source: Ambulatory Visit | Attending: Gastroenterology | Admitting: Gastroenterology

## 2020-09-03 ENCOUNTER — Ambulatory Visit (HOSPITAL_COMMUNITY)
Admission: RE | Admit: 2020-09-03 | Discharge: 2020-09-03 | Disposition: A | Discharge status: Home / Self Care | Payer: BC Managed Care – PPO | Source: Ambulatory Visit | Attending: Gastroenterology | Admitting: Gastroenterology

## 2020-09-03 ENCOUNTER — Other Ambulatory Visit: Payer: Self-pay

## 2020-09-03 DIAGNOSIS — R1011 Right upper quadrant pain: Secondary | ICD-10-CM | POA: Insufficient documentation

## 2020-09-03 MED ORDER — TECHNETIUM TC 99M MEBROFENIN IV KIT
5.5000 | PACK | Freq: Once | INTRAVENOUS | Status: AC | PRN
  Administered 2020-09-03: 5.5 via INTRAVENOUS

## 2020-10-15 ENCOUNTER — Other Ambulatory Visit: Payer: Self-pay | Admitting: Surgery

## 2020-11-17 ENCOUNTER — Ambulatory Visit (HOSPITAL_BASED_OUTPATIENT_CLINIC_OR_DEPARTMENT_OTHER): Admit: 2020-11-17 | Payer: BC Managed Care – PPO | Admitting: Surgery

## 2020-11-17 ENCOUNTER — Encounter (HOSPITAL_BASED_OUTPATIENT_CLINIC_OR_DEPARTMENT_OTHER): Payer: Self-pay

## 2020-11-17 SURGERY — LAPAROSCOPIC CHOLECYSTECTOMY
Anesthesia: General

## 2022-01-21 IMAGING — NM NM HEPATO W/GB/PHARM/[PERSON_NAME]
2 series · 12 of 12 positions shown · non-contrast
Comparison: Ultrasound from same day

CLINICAL DATA: Chronic right upper quadrant abdominal pain

EXAM:
NUCLEAR MEDICINE HEPATOBILIARY IMAGING WITH GALLBLADDER EF
TECHNIQUE: Sequential images of the abdomen were obtained [DATE] minutes
following intravenous administration of radiopharmaceutical. After
oral ingestion of Ensure, gallbladder ejection fraction was
determined. At 60 min, normal ejection fraction is greater than 33%.
RADIOPHARMACEUTICALS:  5.5 mCi Oc-GGm  Choletec IV

[Series 1: biliary · 4.14mm/px · 6 of 60 frames shown]
[frame 6/60]
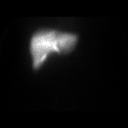
[frame 16/60]
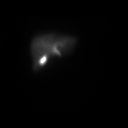
[frame 26/60]
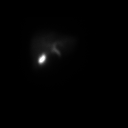
[frame 36/60]
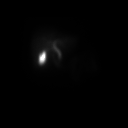
[frame 46/60]
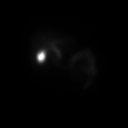
[frame 56/60]
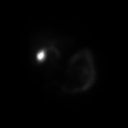

[Series 2: gbef · 4.14mm/px · 6 of 60 frames shown]
[frame 6/60]
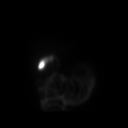
[frame 16/60]
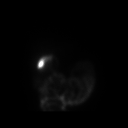
[frame 26/60]
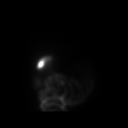
[frame 36/60]
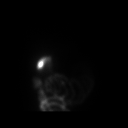
[frame 46/60]
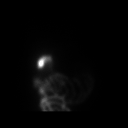
[frame 56/60]
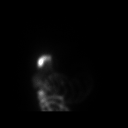

[12 of 12 positions shown; findings below may reference images not displayed]

FINDINGS: There is prompt, uniform radiotracer uptake by the liver with normal
filling of the intrahepatic ducts, common bile duct. Gallbladder
activity is visualized, consistent with patency of cystic duct
(normal < 60 minutes). Additionally there is normal biliary to bowel
transit (normal < 60 minutes), consistent with patent common bile
duct.

Ensure was administered and the gallbladder appears to empty
normally on sequential images. Calculated gallbladder ejection
fraction is 30%. (Normal gallbladder ejection fraction with Ensure
is greater than 33%.)

No evidence of enterogastric biliary reflux.
IMPRESSION: Reduced gallbladder ejection fraction as can be seen with chronic
cholecystitis/biliary dyskinesia.

## 2024-04-13 ENCOUNTER — Other Ambulatory Visit: Payer: Self-pay

## 2024-04-13 DIAGNOSIS — R2232 Localized swelling, mass and lump, left upper limb: Secondary | ICD-10-CM

## 2024-04-19 ENCOUNTER — Other Ambulatory Visit: Payer: Self-pay | Admitting: Obstetrics and Gynecology

## 2024-04-19 DIAGNOSIS — R2232 Localized swelling, mass and lump, left upper limb: Secondary | ICD-10-CM

## 2024-04-20 ENCOUNTER — Inpatient Hospital Stay: Admission: RE | Admit: 2024-04-20 | Discharge: 2024-04-20

## 2024-04-20 ENCOUNTER — Other Ambulatory Visit: Payer: Self-pay

## 2024-04-20 DIAGNOSIS — R2232 Localized swelling, mass and lump, left upper limb: Secondary | ICD-10-CM

## 2025-01-07 ENCOUNTER — Ambulatory Visit: Admitting: Physician Assistant
